# Patient Record
Sex: Female | Born: 1940
Health system: Southern US, Academic
[De-identification: ages and names within clinical notes are randomized; demographics above are authoritative.]

---

## 1997-01-12 ENCOUNTER — Encounter: Payer: Self-pay | Admitting: Internal Medicine

## 1999-01-07 ENCOUNTER — Other Ambulatory Visit (HOSPITAL_COMMUNITY): Payer: Self-pay | Admitting: FAMILY PRACTICE

## 2001-10-25 ENCOUNTER — Other Ambulatory Visit: Admission: RE | Admit: 2001-10-25 | Discharge: 2001-10-25 | Payer: Self-pay | Admitting: Obstetrics and Gynecology

## 2002-02-03 ENCOUNTER — Encounter: Payer: Self-pay | Admitting: Internal Medicine

## 2002-10-12 ENCOUNTER — Encounter: Admission: RE | Admit: 2002-10-12 | Discharge: 2002-10-12 | Payer: Self-pay | Admitting: Endocrinology

## 2002-10-12 ENCOUNTER — Encounter: Payer: Self-pay | Admitting: Endocrinology

## 2003-02-24 ENCOUNTER — Ambulatory Visit (HOSPITAL_COMMUNITY): Payer: Self-pay | Admitting: Family Medicine

## 2004-03-19 ENCOUNTER — Encounter (FREE_STANDING_LABORATORY_FACILITY): Payer: Self-pay | Admitting: Pathology

## 2005-02-07 ENCOUNTER — Encounter (FREE_STANDING_LABORATORY_FACILITY): Payer: Self-pay | Admitting: Pathology

## 2005-08-26 ENCOUNTER — Ambulatory Visit (INDEPENDENT_AMBULATORY_CARE_PROVIDER_SITE_OTHER): Payer: Self-pay | Admitting: "Endocrinology

## 2005-09-05 ENCOUNTER — Ambulatory Visit (INDEPENDENT_AMBULATORY_CARE_PROVIDER_SITE_OTHER): Payer: Self-pay

## 2005-11-03 ENCOUNTER — Encounter (INDEPENDENT_AMBULATORY_CARE_PROVIDER_SITE_OTHER): Payer: Self-pay | Admitting: Specialist

## 2005-11-03 ENCOUNTER — Ambulatory Visit (HOSPITAL_COMMUNITY): Admission: RE | Admit: 2005-11-03 | Discharge: 2005-11-04 | Payer: Self-pay | Admitting: Otolaryngology

## 2006-07-13 ENCOUNTER — Encounter: Admission: RE | Admit: 2006-07-13 | Discharge: 2006-07-13 | Payer: Self-pay | Admitting: Internal Medicine

## 2006-07-13 ENCOUNTER — Ambulatory Visit: Payer: Self-pay | Admitting: Internal Medicine

## 2006-08-31 ENCOUNTER — Inpatient Hospital Stay (HOSPITAL_COMMUNITY): Admission: EM | Admit: 2006-08-31 | Discharge: 2006-09-01 | Payer: Self-pay | Admitting: Emergency Medicine

## 2006-08-31 ENCOUNTER — Ambulatory Visit: Payer: Self-pay | Admitting: Cardiology

## 2006-08-31 ENCOUNTER — Encounter (INDEPENDENT_AMBULATORY_CARE_PROVIDER_SITE_OTHER): Payer: Self-pay | Admitting: Emergency Medicine

## 2006-09-01 ENCOUNTER — Ambulatory Visit: Payer: Self-pay | Admitting: Internal Medicine

## 2006-09-07 ENCOUNTER — Ambulatory Visit: Payer: Self-pay | Admitting: Internal Medicine

## 2006-11-05 ENCOUNTER — Encounter: Payer: Self-pay | Admitting: Internal Medicine

## 2006-11-05 DIAGNOSIS — R519 Headache, unspecified: Secondary | ICD-10-CM | POA: Insufficient documentation

## 2006-11-05 DIAGNOSIS — M19019 Primary osteoarthritis, unspecified shoulder: Secondary | ICD-10-CM | POA: Insufficient documentation

## 2006-11-05 DIAGNOSIS — K219 Gastro-esophageal reflux disease without esophagitis: Secondary | ICD-10-CM

## 2006-11-05 DIAGNOSIS — Z860100 Personal history of colon polyps, unspecified: Secondary | ICD-10-CM | POA: Insufficient documentation

## 2006-11-05 DIAGNOSIS — IMO0001 Reserved for inherently not codable concepts without codable children: Secondary | ICD-10-CM | POA: Insufficient documentation

## 2006-11-05 DIAGNOSIS — M129 Arthropathy, unspecified: Secondary | ICD-10-CM

## 2006-11-05 DIAGNOSIS — R51 Headache: Secondary | ICD-10-CM

## 2006-11-05 DIAGNOSIS — Z9189 Other specified personal risk factors, not elsewhere classified: Secondary | ICD-10-CM | POA: Insufficient documentation

## 2006-11-05 DIAGNOSIS — I10 Essential (primary) hypertension: Secondary | ICD-10-CM | POA: Insufficient documentation

## 2006-11-05 DIAGNOSIS — F329 Major depressive disorder, single episode, unspecified: Secondary | ICD-10-CM

## 2006-12-07 ENCOUNTER — Ambulatory Visit: Payer: Self-pay | Admitting: Internal Medicine

## 2006-12-07 DIAGNOSIS — R252 Cramp and spasm: Secondary | ICD-10-CM

## 2006-12-07 DIAGNOSIS — E109 Type 1 diabetes mellitus without complications: Secondary | ICD-10-CM | POA: Insufficient documentation

## 2006-12-07 DIAGNOSIS — R1310 Dysphagia, unspecified: Secondary | ICD-10-CM | POA: Insufficient documentation

## 2006-12-07 DIAGNOSIS — K222 Esophageal obstruction: Secondary | ICD-10-CM

## 2006-12-07 DIAGNOSIS — K648 Other hemorrhoids: Secondary | ICD-10-CM | POA: Insufficient documentation

## 2006-12-07 DIAGNOSIS — E785 Hyperlipidemia, unspecified: Secondary | ICD-10-CM

## 2006-12-08 LAB — CONVERTED CEMR LAB: CO2: 30 meq/L (ref 19–32)

## 2007-05-03 ENCOUNTER — Ambulatory Visit: Payer: Self-pay | Admitting: Gastroenterology

## 2007-06-04 ENCOUNTER — Ambulatory Visit: Payer: Self-pay | Admitting: Gastroenterology

## 2007-06-04 ENCOUNTER — Encounter: Payer: Self-pay | Admitting: Gastroenterology

## 2007-06-04 ENCOUNTER — Encounter: Payer: Self-pay | Admitting: Internal Medicine

## 2007-06-10 ENCOUNTER — Encounter: Payer: Self-pay | Admitting: Gastroenterology

## 2007-06-10 ENCOUNTER — Telehealth: Payer: Self-pay | Admitting: Gastroenterology

## 2007-06-11 ENCOUNTER — Ambulatory Visit: Payer: Self-pay | Admitting: Cardiology

## 2007-06-11 ENCOUNTER — Ambulatory Visit: Payer: Self-pay | Admitting: Gastroenterology

## 2007-06-11 LAB — CONVERTED CEMR LAB
ALT: 30 units/L (ref 0–35)
Basophils Absolute: 0.1 10*3/uL (ref 0.0–0.1)
Basophils Relative: 0.8 % (ref 0.0–1.0)
CO2: 29 meq/L (ref 19–32)
Calcium: 8.8 mg/dL (ref 8.4–10.5)
Eosinophils Absolute: 0.1 10*3/uL (ref 0.0–0.7)
Eosinophils Relative: 2.1 % (ref 0.0–5.0)
GFR calc Af Amer: 80 mL/min
GFR calc non Af Amer: 66 mL/min
Glucose, Bld: 248 mg/dL — ABNORMAL HIGH (ref 70–99)
HCT: 38.6 % (ref 36.0–46.0)
Hemoglobin: 13.3 g/dL (ref 12.0–15.0)
MCV: 89.1 fL (ref 78.0–100.0)
Neutrophils Relative %: 60.1 % (ref 43.0–77.0)
Platelets: 295 10*3/uL (ref 150–400)
Potassium: 4 meq/L (ref 3.5–5.1)
RBC: 4.33 M/uL (ref 3.87–5.11)
RDW: 12.8 % (ref 11.5–14.6)
Sodium: 140 meq/L (ref 135–145)
Total Protein: 6.5 g/dL (ref 6.0–8.3)
WBC: 6.8 10*3/uL (ref 4.5–10.5)

## 2007-06-14 ENCOUNTER — Encounter: Payer: Self-pay | Admitting: Gastroenterology

## 2007-06-21 ENCOUNTER — Telehealth (INDEPENDENT_AMBULATORY_CARE_PROVIDER_SITE_OTHER): Payer: Self-pay | Admitting: *Deleted

## 2007-06-25 ENCOUNTER — Ambulatory Visit: Payer: Self-pay | Admitting: Internal Medicine

## 2007-06-25 DIAGNOSIS — M1610 Unilateral primary osteoarthritis, unspecified hip: Secondary | ICD-10-CM | POA: Insufficient documentation

## 2007-06-25 DIAGNOSIS — IMO0002 Reserved for concepts with insufficient information to code with codable children: Secondary | ICD-10-CM

## 2007-07-06 ENCOUNTER — Encounter: Payer: Self-pay | Admitting: Internal Medicine

## 2007-07-12 ENCOUNTER — Encounter: Payer: Self-pay | Admitting: Gastroenterology

## 2007-07-12 ENCOUNTER — Ambulatory Visit: Payer: Self-pay | Admitting: Gastroenterology

## 2007-07-14 ENCOUNTER — Encounter: Payer: Self-pay | Admitting: Gastroenterology

## 2007-07-14 ENCOUNTER — Encounter: Admission: RE | Admit: 2007-07-14 | Discharge: 2007-07-14 | Payer: Self-pay | Admitting: Orthopedic Surgery

## 2007-07-22 ENCOUNTER — Encounter: Admission: RE | Admit: 2007-07-22 | Discharge: 2007-07-22 | Payer: Self-pay | Admitting: Orthopedic Surgery

## 2007-08-10 ENCOUNTER — Encounter: Admission: RE | Admit: 2007-08-10 | Discharge: 2007-08-10 | Payer: Self-pay | Admitting: Orthopedic Surgery

## 2007-08-26 ENCOUNTER — Telehealth: Payer: Self-pay | Admitting: Internal Medicine

## 2007-09-01 ENCOUNTER — Ambulatory Visit: Payer: Self-pay | Admitting: Gastroenterology

## 2007-11-24 ENCOUNTER — Encounter: Payer: Self-pay | Admitting: Internal Medicine

## 2007-11-30 ENCOUNTER — Encounter: Payer: Self-pay | Admitting: Internal Medicine

## 2008-03-27 ENCOUNTER — Ambulatory Visit: Payer: Self-pay | Admitting: Internal Medicine

## 2008-03-27 ENCOUNTER — Inpatient Hospital Stay (HOSPITAL_COMMUNITY): Admission: EM | Admit: 2008-03-27 | Discharge: 2008-03-28 | Payer: Self-pay | Admitting: Emergency Medicine

## 2008-03-28 ENCOUNTER — Telehealth: Payer: Self-pay | Admitting: Internal Medicine

## 2008-04-11 ENCOUNTER — Telehealth: Payer: Self-pay | Admitting: Internal Medicine

## 2008-05-03 ENCOUNTER — Encounter: Admission: RE | Admit: 2008-05-03 | Discharge: 2008-05-03 | Payer: Self-pay | Admitting: Orthopedic Surgery

## 2008-05-08 ENCOUNTER — Encounter: Admission: RE | Admit: 2008-05-08 | Discharge: 2008-05-08 | Payer: Self-pay | Admitting: Orthopedic Surgery

## 2008-05-25 ENCOUNTER — Observation Stay (HOSPITAL_COMMUNITY): Admission: EM | Admit: 2008-05-25 | Discharge: 2008-05-26 | Payer: Self-pay | Admitting: Emergency Medicine

## 2008-09-11 ENCOUNTER — Telehealth: Payer: Self-pay | Admitting: Internal Medicine

## 2008-09-18 ENCOUNTER — Encounter: Payer: Self-pay | Admitting: Internal Medicine

## 2008-11-23 ENCOUNTER — Encounter: Payer: Self-pay | Admitting: Internal Medicine

## 2008-11-23 ENCOUNTER — Telehealth: Payer: Self-pay | Admitting: Internal Medicine

## 2009-03-07 ENCOUNTER — Ambulatory Visit: Payer: Self-pay | Admitting: Internal Medicine

## 2009-03-07 ENCOUNTER — Encounter: Admission: RE | Admit: 2009-03-07 | Discharge: 2009-03-07 | Payer: Self-pay | Admitting: Family Medicine

## 2009-03-08 ENCOUNTER — Telehealth: Payer: Self-pay | Admitting: Internal Medicine

## 2009-03-23 ENCOUNTER — Encounter: Payer: Self-pay | Admitting: Internal Medicine

## 2009-05-09 ENCOUNTER — Telehealth: Payer: Self-pay | Admitting: Internal Medicine

## 2009-05-17 ENCOUNTER — Ambulatory Visit: Payer: Self-pay | Admitting: Internal Medicine

## 2009-05-17 DIAGNOSIS — R198 Other specified symptoms and signs involving the digestive system and abdomen: Secondary | ICD-10-CM | POA: Insufficient documentation

## 2009-05-21 ENCOUNTER — Ambulatory Visit: Payer: Self-pay | Admitting: Cardiology

## 2009-05-28 ENCOUNTER — Telehealth: Payer: Self-pay | Admitting: Internal Medicine

## 2009-05-29 ENCOUNTER — Telehealth: Payer: Self-pay | Admitting: Internal Medicine

## 2009-05-30 ENCOUNTER — Telehealth: Payer: Self-pay | Admitting: Internal Medicine

## 2009-09-07 ENCOUNTER — Telehealth (INDEPENDENT_AMBULATORY_CARE_PROVIDER_SITE_OTHER): Payer: Self-pay | Admitting: *Deleted

## 2010-02-24 ENCOUNTER — Encounter: Payer: Self-pay | Admitting: Orthopedic Surgery

## 2010-03-03 LAB — CONVERTED CEMR LAB
Basophils Absolute: 0.1 10*3/uL (ref 0.0–0.1)
Basophils Relative: 0.9 % (ref 0.0–3.0)
Calcium: 9 mg/dL (ref 8.4–10.5)
Chloride: 101 meq/L (ref 96–112)
Creatinine, Ser: 0.9 mg/dL (ref 0.4–1.2)
Eosinophils Absolute: 0.2 10*3/uL (ref 0.0–0.7)
Eosinophils Relative: 2.8 % (ref 0.0–5.0)
HCT: 38.7 % (ref 36.0–46.0)
Hemoglobin: 12.9 g/dL (ref 12.0–15.0)
Lymphocytes Relative: 36.5 % (ref 12.0–46.0)
Lymphs Abs: 2.4 10*3/uL (ref 0.7–4.0)
Monocytes Absolute: 0.5 10*3/uL (ref 0.1–1.0)
Neutro Abs: 3.5 10*3/uL (ref 1.4–7.7)
WBC: 6.7 10*3/uL (ref 4.5–10.5)

## 2010-03-05 NOTE — Letter (Signed)
Summary: Reather Littler MD  Reather Littler MD   Imported By: [address] 05/23/2009 09:48:11  _____________________________________________________________________  External Attachment:    Type:   Image     Comment:   External Document

## 2010-03-05 NOTE — Letter (Signed)
Summary: [facility]  [facility]   Imported By: Sherian Rein 03/28/2009 08:18:05  _____________________________________________________________________  External Attachment:    Type:   Image     Comment:   External Document

## 2010-03-05 NOTE — Progress Notes (Signed)
  Phone Note Other Incoming   Request: Send information Action Taken: Software engineer of Call: Request for records received from [facility]. Request forwarded to [employer].

## 2010-03-05 NOTE — Assessment & Plan Note (Signed)
Summary: PER KELLY--R SIDE PAIN--PER PT D/T--STC   Vital Signs:  Patient profile:   70 year old female Height:      67 inches (170.18 cm) Weight:      218 pounds (99.09 kg) BMI:     34.27 O2 Sat:      95 % on Room air Temp:     98.7 degrees F (37.06 degrees C) oral Pulse rate:   92 / minute Pulse rhythm:   regular BP sitting:   102 / 74  (left arm) Cuff size:   large  Vitals Entered By: Brenton Grills (May 17, 2009 8:53 AM)  O2 Flow:  Room air CC: pt c/o left side pain in lower back that is sharp and stabbing at times. pt describes the pain 10/10 when she has it/pt also states that her insurance will no longer pay for omeprazole and will need a replacement/aj   Primary Care Provider:  Lucas Mallow, MD  CC:  pt c/o left side pain in lower back that is sharp and stabbing at times. pt describes the pain 10/10 when she has it/pt also states that her insurance will no longer pay for omeprazole and will need a replacement/aj.  History of Present Illness: Patient returns for intermittent sharp stabbing pain in the left flank. This is the same problem for which she was seen in Feb. Renal U/S was done and was normal. Labs including CBC - nl, Metabolic - normal except for glucose 300+, A1C 8.2%, B12 266. She has not had hematuria, no dysuria, no fever or chills. She has had abdominal pain, heartburn, etc since stopping omeprazole 40mg . She does report recent episode of hematochezia with no prior h/o hemorrhoids. Bowel movement has been painful and she has had an intermittent/variable bowel habit.  Current Medications (verified): 1)  Novolog 100 Unit/ml  Soln (Insulin Aspart) .... Insulin Pump -Take As Directed 2)  Micardis Hct 80-12.5 Mg  Tabs (Telmisartan-Hctz) .... Take 1 Tablet By Mouth Once A Day 3)  Clonidine Hcl 0.1 Mg  Tabs (Clonidine Hcl) .... Take 1 Tablet By Mouth Two Times A Day 4)  Cymbalta 60 Mg  Cpep (Duloxetine Hcl) .... Take 1 Tablet By Mouth Once A Day 5)  Zocor 40 Mg   Tabs (Simvastatin) .... Take 1 Tablet By Mouth Once A Day 6)  Aggrenox 25-200 Mg  Cp12 (Aspirin-Dipyridamole) .... Take 1 Tablet By Mouth Two Times A Day 7)  Omeprazole 20 Mg  Tbec (Omeprazole) .... Take 2 Tablet By Mouth Two Times A Day 8)  Fish Oil 1200 Mg  Caps (Omega-3 Fatty Acids) .... Take 2 Tablet By Mouth Once A Day 9)  Vitamin B Complex-C   Caps (B Complex-C) .... Take 1 Tablet By Mouth Once A Day 10)  Multivitamins   Tabs (Multiple Vitamin) .... Take 1 Tablet By Mouth Once A Day 11)  Nasonex 50 Mcg/act Susp (Mometasone Furoate) .Marland Kitchen.. 1-2 Sprays Each Nostril Once Daily 12)  Advair Diskus 250-50 Mcg/dose Misc (Fluticasone-Salmeterol) .Marland Kitchen.. 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) HEADACHE (ICD-784.0) DEPRESSION (ICD-311) * HISTORY OF MINI STROKES dysphagaia esophageal stricture plantar fascitiits hemorrhoids Physician Roster:  Endo - Drinda Butts                            GIGerilyn Pilgrim                            Gyn - Harvett Corder, PA Hyperlipidemia  Past Surgical History: Last updated: 11/05/2006 Hysterectomy Thyroidectomy Cholecystectomy * SALIVARY GLAND EXCISION * JAW SURGERY BREAST BIOPSY, HX OF (ICD-V15.9)  Family History: Last updated: 12/07/2006 arthritis in both parents grandparent with colon cancer heart disease in both parents stroke in mother and sister. 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  The patient denies anorexia, fever, weight loss, weight gain, hoarseness, chest pain, syncope, dyspnea on exertion, muscle weakness, suspicious skin lesions, difficulty walking, unusual weight change, and angioedema.    Physical Exam  General:  obese white female in no distress Head:  Normocephalic and atraumatic without obvious abnormalities. No apparent alopecia or balding. Eyes:  corneas and lenses clear and no injection.   Lungs:  normal respiratory effort.   Heart:  normal rate and regular rhythm.   Abdomen:  obese, BS +, insulin pump right lower quadrant, no guarding or rebound, no masses. Rectal:  NST, internal hemorrhoid - small- at 0700 position. Stool heme negative.   Impression & Recommendations:  Problem # 1:  FLANK PAIN, LEFT (ICD-789.09) Negative eval to date. Her pain may be related to nephrolithiasis.  Plan - CT for stone/.  Her updated medication list for this problem includes:    Nabumetone 500 Mg Tabs (Nabumetone)  Orders: Radiology Referral (Radiology)  Problem # 2:  HYPERTENSION (ICD-401.9)  Her updated medication list for this problem includes:    Micardis Hct 80-12.5 Mg Tabs (Telmisartan-hctz) .Marland Kitchen... Take 1 tablet by mouth once a day    Clonidine Hcl 0.1 Mg Tabs (Clonidine hcl) .Marland Kitchen... Take 1 tablet by mouth two times a day  BP today: 102/74 Prior BP: 124/70 (03/07/2009)  Labs Reviewed: K+: 3.7 (03/07/2009) Creat: : 0.9 (03/07/2009)     Great control.   Problem # 3:  INTERNAL HEMORRHOIDS WITHOUT MENTION COMP (ICD-455.0) Patient with report of blood on tissue. Exam with small internal hemorrhoid.  Plan - sitz baths           no straining at stool   Problem # 4:  CHANGE IN BOWELS (ZOX-096.04) Patient with an  irregular bowel habit.   Plan - bulk laxative of choice on a routine basis.   Problem # 5:  GERD (ICD-530.81) Patient with mild symptoms of GERD  Plan - trial of H2 blocker for control of symptoms.  Her updated medication list for this problem includes:    Ranitidine Hcl 150 Mg Caps (Ranitidine hcl) .Marland Kitchen... 1 by mouth two times a day  Complete Medication List: 1)  Novolog 100 Unit/ml Soln (Insulin aspart) .... Insulin pump -take as directed 2)  Micardis Hct 80-12.5 Mg Tabs (Telmisartan-hctz) .... Take 1 tablet by mouth once a day 3)  Clonidine Hcl 0.1 Mg Tabs (Clonidine hcl) .... Take 1 tablet by mouth  two times a day 4)  Cymbalta 60 Mg Cpep (Duloxetine hcl) .... Take 1 tablet by mouth once a day 5)  Zocor 40 Mg Tabs (Simvastatin) .... Take 1 tablet by mouth once a day 6)  Aggrenox 25-200 Mg Cp12 (Aspirin-dipyridamole) .... Take 1 tablet by mouth two times a day 7)  Ranitidine Hcl 150 Mg Caps (Ranitidine hcl) .Marland Kitchen.. 1 by mouth two times a day 8)  Fish Oil 1200 Mg Caps (Omega-3 fatty acids) .... Take 2 tablet by mouth once a day 9)  Vitamin B Complex-c Caps (B complex-c) .... Take 1 tablet by mouth once a day 10)  Multivitamins Tabs (Multiple vitamin) .... Take 1 tablet by mouth once a day 11)  Nasonex 50 Mcg/act Susp (Mometasone furoate) .Marland Kitchen.. 1-2 sprays each nostril once daily 12)  Advair Diskus 250-50 Mcg/dose Misc (Fluticasone-salmeterol) .Marland Kitchen.. 1 puff two times a day 13)  Nabumetone 500 Mg Tabs (Nabumetone) 14)  Omega-3 1000 Mg Caps (Omega-3 fatty acids)  Patient Instructions: 1)  Intermittent flank pain - may be a stone. Exam is normal. U/S kidney was normal in Feb. Plan - CT scan to look for stone. 2)  Blood on toilet tissue - most likely a minor hemorrhoid. NO sign of colon bleeding. 3)  Irregular bowel habit - take metamucil, or equivalent, daily to regulate bowel habit 4)  Dyspepsia- for heartburn, etc. take over the counter Ranitidine 150mg  two times a day.   Prescriptions: RANITIDINE HCL 150 MG CAPS (RANITIDINE HCL) 1 by mouth two times a day  #60 x 12   Entered and Authorized by:   Jacques Navy MD   Signed by:   Jacques Navy MD on 05/17/2009   Method used:   Print then Give to Patient   RxID:   [id number]    Preventive Care Screening  Mammogram:    Date:  02/03/2009    Results:  normal

## 2010-03-05 NOTE — Progress Notes (Signed)
Summary: REFERRAL  Phone Note From Other Clinic   Summary of Call: Please see other phone note, Pt continues to c/o pain, please put in urology referral. Thanks Initial call taken by: Lamar Sprinkles, CMA,  May 29, 2009 6:55 PM  Follow-up for Phone Call        [facility] notified Follow-up by: Jacques Navy MD,  May 30, 2009 9:09 AM

## 2010-03-05 NOTE — Assessment & Plan Note (Signed)
Summary: FEELS LIKE THERE IS A LUMP WHEN  LAYS ON SIDE/ SINUS PROBLEMS...   Vital Signs:  Patient profile:   70 year old female Height:      67 inches Weight:      217 pounds BMI:     34.11 O2 Sat:      98 % on Room air Temp:     97.1 degrees F oral Pulse rate:   80 / minute BP sitting:   124 / 70  (left arm) Cuff size:   regular  Vitals Entered By: Bill Salinas CMA (March 07, 2009 8:55 AM)  O2 Flow:  Room air CC: pt here with complaint of ongoing sinus drainage with cough x 1 months. She also complains of what she describes as a lump or knot on her left side of her back, pt has never had zostavax or bone density screening, Dr Claretha Cooper is her GYN doc/ ab   Primary Care Provider:  Lucas Mallow, MD  CC:  pt here with complaint of ongoing sinus drainage with cough x 1 months. She also complains of what she describes as a lump or knot on her left side of her back, pt has never had zostavax or bone density screening, and Dr Claretha Cooper is her GYN doc/ ab.  History of Present Illness: C/o pain in the left flank: it hurts when she lays on that side - feels like a golf ball. She will occasionally get a real sharp pain. No dysuria, no frequency, no F/S/E. No trauma or physical strain. No blood in the urine. No weight. No night sweats.   She sees Dr. Lucianne Muss for diabetes. He stated she might need her "hormones" checked due to difficulty in controlling her diabetes.   Current Medications (verified): 1)  Novolog 100 Unit/ml  Soln (Insulin Aspart) .... Insulin Pump -Take As Directed 2)  Micardis Hct 80-12.5 Mg  Tabs (Telmisartan-Hctz) .... Take 1 Tablet By Mouth Once A Day 3)  Clonidine Hcl 0.1 Mg  Tabs (Clonidine Hcl) .... Take 1 Tablet By Mouth Two Times A Day 4)  Cymbalta 60 Mg  Cpep (Duloxetine Hcl) .... Take 1 Tablet By Mouth Once A Day 5)  Zocor 40 Mg  Tabs (Simvastatin) .... Take 1 Tablet By Mouth Once A Day 6)  Aggrenox 25-200 Mg  Cp12 (Aspirin-Dipyridamole) .... Take 1 Tablet By Mouth  Two Times A Day 7)  Omeprazole 20 Mg  Tbec (Omeprazole) .... Take 2 Tablet By Mouth Two Times A Day 8)  Fish Oil 1200 Mg  Caps (Omega-3 Fatty Acids) .... Take 2 Tablet By Mouth Once A Day 9)  Vitamin B Complex-C   Caps (B Complex-C) .... Take 1 Tablet By Mouth Once A Day 10)  Multivitamins   Tabs (Multiple Vitamin) .... Take 1 Tablet By Mouth Once A Day 11)  Nasonex 50 Mcg/act Susp (Mometasone Furoate) .Marland Kitchen.. 1-2 Sprays Each Nostril Once Daily 12)  Advair Diskus 250-50 Mcg/dose Misc (Fluticasone-Salmeterol) .Marland Kitchen.. 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) HEADACHE (ICD-784.0) DEPRESSION (ICD-311) * HISTORY OF MINI STROKES dysphagaia esophageal stricture plantar fascitiits hemorrhoids Physician Roster:  Endo - Drinda Butts                            GIGerilyn Pilgrim                            Gyn - Harvett Corder, PA Hyperlipidemia  Past Surgical History: Last updated: 11/05/2006 Hysterectomy Thyroidectomy Cholecystectomy * SALIVARY GLAND EXCISION * JAW SURGERY BREAST BIOPSY, HX OF (ICD-V15.9)  Family History: Last updated: 12/07/2006 arthritis in both parents grandparent with colon cancer heart disease in both parents stroke in mother and sister. Hypertension in both parents Diabetes mother and sister  Family History: Reviewed history from 12/07/2006 and no changes required. arthritis in both parents grandparent with colon cancer heart disease in both parents stroke in mother and sister. 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    Physical Exam  General:  alert, well-developed, well-nourished, and well-hydrated, overweight.   Head:  normocephalic, atraumatic, and no abnormalities observed.   Eyes:  pupils equal, pupils round, corneas and lenses clear, and no injection.   Neck:  supple, no thyromegaly, and no carotid bruits.   Chest Wall:  no deformity Lungs:  Clear to percussion. No increased work of breathing Heart:  normal rate, regular rhythm, and no murmur.   Abdomen:  tneder to palpation of the left fland and tender to percussion. No CVAT. BS+, no guarding. Tender to gentle movement, palpation in the left quadrants. No masses appreciated. Msk:  No deformity or scoliosis noted of thoracic or lumbar spine.   Pulses:  2+ radial Neurologic:  alert & oriented X3, cranial nerves II-XII intact, strength normal in all extremities, and gait normal.   Skin:  Intact without suspicious lesions or rashes Cervical Nodes:  no anterior cervical adenopathy and no posterior cervical adenopathy.   Axillary Nodes:  no R axillary adenopathy and no L axillary adenopathy.   Psych:  Oriented X3, memory intact for recent and remote, normally interactive, and good eye contact.    Patient: Norma Young Note: All result statuses are Final unless otherwise noted.  Tests: (1) BMP (METABOL)   Sodium                    136 mEq/L                   135-145   Potassium                 3.7 mEq/L                    3.5-5.1   Chloride                  101 mEq/L                   96-112   Carbon Dioxide            29 mEq/L  19-32   Glucose              [H]  380 mg/dL                   09-81   BUN                       8 mg/dL                     1-91   Creatinine                0.9 mg/dL                   4.7-8.2   Calcium                   9.0 mg/dL                   9.5-62.1   GFR                       66.02 mL/min                >60  Tests: (2) CBC Platelet w/Diff (CBCD)   White Cell Count          6.7 K/uL                    4.5-10.5   Red Cell Count            4.29 Mil/uL                 3.87-5.11   Hemoglobin                12.9 g/dL                   30.8-65.7   Hematocrit                38.7 %                      36.0-46.0   MCV                       90.2 fl                     78.0-100.0   MCHC                      33.3 g/dL                   84.6-96.2   RDW                       12.0 %                      11.5-14.6   Platelet Count            317.0 K/uL                  150.0-400.0   Neutrophil %              52.1 %                      43.0-77.0   Lymphocyte %  36.5 %                      12.0-46.0   Monocyte %                7.7 %                       3.0-12.0   Eosinophils%              2.8 %                       0.0-5.0   Basophils %               0.9 %                       0.0-3.0   Neutrophill Absolute      3.5 K/uL                    1.4-7.7   Lymphocyte Absolute       2.4 K/uL                    0.7-4.0   Monocyte Absolute         0.5 K/uL                    0.1-1.0  Eosinophils, Absolute                             0.2 K/uL                    0.0-0.7   Basophils Absolute        0.1 K/uL                    0.0-0.1  Tests: (3) Hemoglobin A1C (A1C)   Hemoglobin A1C       [H]  8.5 %                       4.6-6.5     Glycemic Control Guidelines for People with Diabetes:     Non Diabetic:  <6%     Goal of Therapy: <7%     Additional Action  Suggested:  >8%   Tests: (4) B12 + Folate Panel (B12/FOL)   Vitamin B12               266 pg/mL                   211-911   Folate                    18.1 ng/mL     Deficient  0.4 - 3.4 ng/mL     Indeterminate  3.4 - 5.4 ng/mL     Normal  >5.4 ng/mL  Tests: (5) TSH (TSH)   FastTSH                   2.02 uIU/mL                 0.35-5.50 Impression & Recommendations:  Problem # 1:  FLANK PAIN, LEFT (ICD-789.09)  Patient with left flank pain and tenderness on exam. No symptoms of UTI. concern for renal cell carcinoma or other structual problem vs. nephrolithiasis.  Plan -  lab - U/A with micro, CBC, BMET           Renal U/S - today at [facility] and [facility] at 11:15 - pt aware.  Her updated medication list for this problem includes:    Nabumetone 500 Mg Tabs (Nabumetone)  Addendum - lab is normal - normal renal function. U/A evidently not done.                     Renal ultra-sound normal: no structual abnormalities or stones  Suspect her pain may be musculo-skeletal. For persistence will need to consider CT. Orders: TLB-BMP (Basic Metabolic Panel-BMET) (80048-METABOL) TLB-CBC Platelet - w/Differential (85025-CBCD) Prescription Created Electronically [phone]) TLB-Udip w/ Micro (81001-URINE) Radiology Referral (Radiology)  Complete Medication List: 1)  Novolog 100 Unit/ml Soln (Insulin aspart) .... Insulin pump -take as directed 2)  Micardis Hct 80-12.5 Mg Tabs (Telmisartan-hctz) .... Take 1 tablet by mouth once a day 3)  Clonidine Hcl 0.1 Mg Tabs (Clonidine hcl) .... Take 1 tablet by mouth two times a day 4)  Cymbalta 60 Mg Cpep (Duloxetine hcl) .... Take 1 tablet by mouth once a day 5)  Zocor 40 Mg Tabs (Simvastatin) .... Take 1 tablet by mouth once a day 6)  Aggrenox 25-200 Mg Cp12 (Aspirin-dipyridamole) .... Take 1 tablet by mouth two times a day 7)  Omeprazole 20 Mg Tbec (Omeprazole) .... Take 2 tablet by mouth two times a day 8)  Fish Oil 1200 Mg Caps (Omega-3 fatty acids)  .... Take 2 tablet by mouth once a day 9)  Vitamin B Complex-c Caps (B complex-c) .... Take 1 tablet by mouth once a day 10)  Multivitamins Tabs (Multiple vitamin) .... Take 1 tablet by mouth once a day 11)  Nasonex 50 Mcg/act Susp (Mometasone furoate) .Marland Kitchen.. 1-2 sprays each nostril once daily 12)  Advair Diskus 250-50 Mcg/dose Misc (Fluticasone-salmeterol) .Marland Kitchen.. 1 puff two times a day 13)  Nabumetone 500 Mg Tabs (Nabumetone) 14)  Omega-3 1000 Mg Caps (Omega-3 fatty acids)  Other Orders: TLB-A1C / Hgb A1C (Glycohemoglobin) (83036-A1C) TLB-B12 + Folate Pnl (60454_[id number]/NWG) TLB-TSH (Thyroid Stimulating Hormone) (84443-TSH)  S RENAL - [id number]     Clinical Data: Left flank pain   RENAL/URINARY TRACT ULTRASOUND COMPLETE   Comparison:  None.   Findings:   Right Kidney:  11.4 cm in length.  No hydronephrosis.  There is a 4 cm simple cyst of the upper pole. No hydronephrosis.   Left Kidney:  10.6 cm.  No hydronephrosis, cysts, or masses.   No renal calculi are noted.  No dilatation of the ureters.   Bladder:  Only minimally distended without obvious pathology.   IMPRESSION:   1.  Normal renal size without hydronephrosis or calculi. 2.  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 3.  No acute or significant findings.   Read By:  Bernerd Limbo,  M.D.Prescriptions: OMEPRAZOLE 20 MG  TBEC (OMEPRAZOLE) Take 2 tablet by mouth two times a day  #90 x 3   Entered and Authorized by:   Jacques Navy MD   Signed by:   Jacques Navy MD on 03/07/2009   Method used:   Electronically to        [facility]  [address] (retail)       [address]       [place], [place]  [address]       Ph: [phone]  Fax: [phone]   RxID:   [id number]    Preventive Care Screening  Mammogram:    Date:  11/07/2008    Results:  normal   Last Flu Shot:    Date:  11/04/2008    Results:  given

## 2010-03-05 NOTE — Progress Notes (Signed)
Summary: results  Phone Note Call from Patient Call back at Home Phone [phone]   Summary of Call: Patient called for lab/xray results. Please advise. Initial call taken by: Lucious Groves,  March 08, 2009 2:55 PM  Follow-up for Phone Call        Renal U/S was normal with no findings. the labs were normal except for a A1C of 8.5%. Suspect her pain is a musculo-skeletal problem. Please continue to take nabumetone 500mg  2 tabs at bedtime. For persistent pain or worsening symptoms the next step will be a CT scan abdomen Follow-up by: Jacques Navy MD,  March 08, 2009 3:09 PM  Additional Follow-up for Phone Call Additional follow up Details #1::        Notified pt with both labs & u/s results and md recomendations Additional Follow-up by: Orlan Leavens,  March 08, 2009 3:33 PM

## 2010-03-05 NOTE — Progress Notes (Signed)
Summary: Urology referral  Phone Note Outgoing Call   Call placed by: Dagoberto Reef,  May 30, 2009 2:53 PM Summary of Call: Dr Filbert Schilder, Norma Young pt about appt with Dr Vernie Ammons ([facility]) 06/19/09 @ 1245pm, pt said she was moving to [place] the wk of (may 2-6).Said she would have to find another primary care doctor there and have them refer her to a urologist.Pt declined appt. Initial call taken by: Dagoberto Reef,  May 30, 2009 2:56 PM  Follow-up for Phone Call        noted. Follow-up by: Jacques Navy MD,  May 30, 2009 3:30 PM

## 2010-03-05 NOTE — Progress Notes (Signed)
Summary: not better  Phone Note Call from Patient Call back at Home Phone [phone]   Summary of Call: Patient left message on triage that she is not better, c/o pain in her sides/kidneys, and spine. Please advise. Initial call taken by: Lucious Groves,  May 09, 2009 9:55 AM  Follow-up for Phone Call        reviewed last office note, labs- normal, abdominal U/S normal.  For continued pain recommend repeat OV. May take her current meds and may add APAP 1000mg  three times a day until seen. Follow-up by: Jacques Navy MD,  May 09, 2009 10:46 AM  Additional Follow-up for Phone Call Additional follow up Details #1::        Patient notified and states that she takes her current pain med two times a day. Patient also states that Dr. Lucianne Muss gave her a muscles relaxer. Transferred patient to schedule appt. Additional Follow-up by: Lucious Groves,  May 09, 2009 3:50 PM

## 2010-03-05 NOTE — Progress Notes (Signed)
Summary: RESULTS  Phone Note Call from Patient Call back at Home Phone [phone]   Summary of Call: Patient is requesting results of CT Initial call taken by: Lamar Sprinkles, CMA,  May 28, 2009 3:46 PM  Follow-up for Phone Call        CT negative for any cause of her discomfort. 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  Follow-up by: Jacques Navy MD,  May 28, 2009 5:42 PM  Additional Follow-up for Phone Call Additional follow up Details #1::        Pt continues to c/o pain, same as last seen.  Additional Follow-up by: Lamar Sprinkles, CMA,  May 28, 2009 6:18 PM    Additional Follow-up for Phone Call Additional follow up Details #2::    Pt is concerned with cyst found on ct, she states she has pain when she lays on her left side. Pt wants to know what if anything will be the next step regarding this cyst.  Follow-up by: Ami Bullins CMA,  May 29, 2009 10:22 AM  Additional Follow-up for Phone Call Additional follow up Details #3:: Details for Additional Follow-up Action Taken: Renal cysts are common. This one is mid-range in size and is not a cause of discomfort. For continued pain I am happy to refer to urology for a second opinion about the renal cyst. Additional Follow-up by: Jacques Navy MD,  May 29, 2009 1:02 PM

## 2010-05-15 LAB — URINALYSIS, ROUTINE W REFLEX MICROSCOPIC
Bilirubin Urine: NEGATIVE
Glucose, UA: >1000 mg/dL — AB
Ketones, ur: 40 mg/dL — AB
Nitrite: NEGATIVE
Specific Gravity, Urine: 1.032 — ABNORMAL HIGH (ref 1.005–1.030)
pH: 6 (ref 5.0–8.0)

## 2010-05-15 LAB — COMPREHENSIVE METABOLIC PANEL
AST: 32 U/L (ref 0–37)
Calcium: 8.7 mg/dL (ref 8.4–10.5)
Chloride: 102 mEq/L (ref 96–112)
Creatinine, Ser: 1.02 mg/dL (ref 0.4–1.2)
Potassium: 4.1 mEq/L (ref 3.5–5.1)
Total Protein: 6.3 g/dL (ref 6.0–8.3)

## 2010-05-15 LAB — BASIC METABOLIC PANEL
Calcium: 8.5 mg/dL (ref 8.4–10.5)
Creatinine, Ser: 0.7 mg/dL (ref 0.4–1.2)
GFR calc Af Amer: >60 mL/min (ref >60)
GFR calc non Af Amer: >60 mL/min (ref >60)
Sodium: 136 mEq/L (ref 135–145)

## 2010-05-15 LAB — GLUCOSE, CAPILLARY
Glucose-Capillary: 231 mg/dL — ABNORMAL HIGH (ref 70–99)
Glucose-Capillary: 249 mg/dL — ABNORMAL HIGH (ref 70–99)
Glucose-Capillary: 290 mg/dL — ABNORMAL HIGH (ref 70–99)
Glucose-Capillary: 34 mg/dL — CL (ref 70–99)
Glucose-Capillary: 500 mg/dL — ABNORMAL HIGH (ref 70–99)
Glucose-Capillary: 540 mg/dL (ref 70–99)

## 2010-05-15 LAB — CBC
Hemoglobin: 12.9 g/dL (ref 12.0–15.0)
MCHC: 34 g/dL (ref 30.0–36.0)
MCV: 88.3 fL (ref 78.0–100.0)
Platelets: 263 10*3/uL (ref 150–400)
RBC: 4.31 MIL/uL (ref 3.87–5.11)
RDW: 12.8 % (ref 11.5–15.5)

## 2010-05-15 LAB — URINE MICROSCOPIC-ADD ON

## 2010-05-15 LAB — DIFFERENTIAL
Basophils Absolute: 0 10*3/uL (ref 0.0–0.1)
Basophils Relative: 0 % (ref 0–1)
Eosinophils Relative: 1 % (ref 0–5)
Lymphs Abs: 0.9 10*3/uL (ref 0.7–4.0)
Monocytes Relative: 4 % (ref 3–12)
Neutrophils Relative %: 87 % — ABNORMAL HIGH (ref 43–77)

## 2010-05-21 LAB — POCT I-STAT, CHEM 8
Glucose, Bld: 94 mg/dL (ref 70–99)
HCT: 38 % (ref 36.0–46.0)
Hemoglobin: 12.9 g/dL (ref 12.0–15.0)
Potassium: 3.4 mEq/L — ABNORMAL LOW (ref 3.5–5.1)
Sodium: 139 mEq/L (ref 135–145)

## 2010-05-21 LAB — GLUCOSE, CAPILLARY
Glucose-Capillary: 110 mg/dL — ABNORMAL HIGH (ref 70–99)
Glucose-Capillary: 117 mg/dL — ABNORMAL HIGH (ref 70–99)
Glucose-Capillary: 132 mg/dL — ABNORMAL HIGH (ref 70–99)
Glucose-Capillary: 231 mg/dL — ABNORMAL HIGH (ref 70–99)
Glucose-Capillary: 270 mg/dL — ABNORMAL HIGH (ref 70–99)
Glucose-Capillary: 485 mg/dL — ABNORMAL HIGH (ref 70–99)
Glucose-Capillary: 511 mg/dL (ref 70–99)
Glucose-Capillary: 98 mg/dL (ref 70–99)

## 2010-05-21 LAB — DIFFERENTIAL
Basophils Absolute: 0 10*3/uL (ref 0.0–0.1)
Eosinophils Absolute: 0.1 10*3/uL (ref 0.0–0.7)
Eosinophils Relative: 1 % (ref 0–5)

## 2010-05-21 LAB — COMPREHENSIVE METABOLIC PANEL
ALT: 23 U/L (ref 0–35)
AST: 24 U/L (ref 0–37)
CO2: 27 mEq/L (ref 19–32)
Chloride: 103 mEq/L (ref 96–112)
Creatinine, Ser: 0.78 mg/dL (ref 0.4–1.2)
GFR calc Af Amer: >60 mL/min (ref >60)
GFR calc non Af Amer: >60 mL/min (ref >60)
Sodium: 136 mEq/L (ref 135–145)
Total Bilirubin: 0.3 mg/dL (ref 0.3–1.2)

## 2010-05-21 LAB — URINALYSIS, ROUTINE W REFLEX MICROSCOPIC
Glucose, UA: 100 mg/dL — AB
Protein, ur: NEGATIVE mg/dL
Specific Gravity, Urine: 1.011 (ref 1.005–1.030)
pH: 6.5 (ref 5.0–8.0)

## 2010-05-21 LAB — PROTIME-INR: Prothrombin Time: 13.6 seconds (ref 11.6–15.2)

## 2010-05-21 LAB — URINE CULTURE: Colony Count: 80000

## 2010-05-21 LAB — CBC
Hemoglobin: 12.4 g/dL (ref 12.0–15.0)
MCV: 89.7 fL (ref 78.0–100.0)
RBC: 4.03 MIL/uL (ref 3.87–5.11)
WBC: 11.4 10*3/uL — ABNORMAL HIGH (ref 4.0–10.5)

## 2010-05-21 LAB — HEMOGLOBIN A1C: Hgb A1c MFr Bld: 7 % — ABNORMAL HIGH (ref 4.6–6.1)

## 2010-05-21 LAB — POCT CARDIAC MARKERS: Myoglobin, poc: 112 ng/mL (ref 12–200)

## 2010-05-21 LAB — URINE MICROSCOPIC-ADD ON

## 2010-05-21 LAB — LIPASE, BLOOD: Lipase: 19 U/L (ref 11–59)

## 2010-06-18 NOTE — H&P (Signed)
Norma [name], Norma [name]               ACCOUNT NO.:  [id number]   MEDICAL RECORD NO.:  [id number]          PATIENT TYPE:  INP   LOCATION:  6707                         FACILITY:  [facility]   PHYSICIAN:  Valerie A. Felicity Coyer, MDDATE OF BIRTH:  1940-06-19   DATE OF ADMISSION:  08/31/2006  DATE OF DISCHARGE:                              HISTORY & PHYSICAL   PRIMARY CARE PHYSICIAN:  Rosalyn Gess. Norins, M.D.   ENDOCRINOLOGIST:  Reather Littler, M.D.   CHIEF COMPLAINT:  Left-sided weakness.   HISTORY OF PRESENT ILLNESS:  The patient is a 70 year old woman, obese,  with multiple risk factors and previous history of TIA who presents to  the emergency room today complaining of left-sided weakness.  She  reports she first noticed the symptoms around 5:00 a.m. this morning  when she awoke, feeling like her left leg was dragging and her right arm  was discoordinated.  However, she ate her breakfast and had her coffee  prior to going to physical therapy for her routine scheduled.  At  approximately 9 to 9:30 this morning the physical therapist confirmed  that the left side was in fact weak.  She was referred to Dr. Debby Bud'  office who then sent her to the emergency room for further evaluation.  Her family notes slurring of speech and drooling earlier along with  facial asymmetry which now seems to have improved.  The patient at this  time feels improved overall with increased and improved left upper  extremity coordination.  Unclear of her left foot symptoms, as she  reports she has not been off the stretcher since her arrival in the  emergency room.   PAST MEDICAL HISTORY:  1. Type 2 diabetes for which she has a continuous NovoLog pump.  See      details below.  2. Hypertension.  3. Dyslipidemia.  4. History of TIAs.  5. Osteoarthritis, bilateral knees and shoulders.  6. History of depression.  7. History of fibromyalgia.  8. History of GERD.  9. History of colon polyps in 2000.   PAST SURGICAL  HISTORY:  1. Salivary gland excision in October of last year.  2. Breast biopsy in 1993 which was benign.  3. Cholecystectomy in 1987.   SOCIAL HISTORY:  She is retired and divorced.  She has two sons and two  daughters who are present, showing a very supportive family involved in  her care.  She does not smoke and does not drink.   FAMILY HISTORY:  Significant for mother who passed away from  complications of stroke and also suffered with heart disease, type 2  diabetes, and hypertension.  Father also passed away due to cardiac  abnormalities.  He had history of hypertension.   CURRENT MEDICATIONS:  1. NovoLog insulin pump with the wizard bolus.  Self-adjusting rate      is as follows:  12 midnight 1.4 units; 5 a.m., 2.1 units; 10 a.m.,      2.2 units; 1 p.m., 2.0 units; and 10 p.m., 1.8 units.  She corrects      for carb intake as per home schedule and  reports that this has been      working well.  2. Micardis 80/12.5 daily,  3. Zoloft 50 mg daily.  4. Clonidine 0.1 mg b.i.d.  5. Cymbalta 60 mg daily.  6. Zocor 40 mg q.h.s.  7. Aspirin 81 mg daily.   ALLERGIES:  NO KNOWN DRUG ALLERGIES.   REVIEW OF SYSTEMS:  She denies fevers, chills.  No shortness of breath  or chest pain.  No nausea, vomiting, or diarrhea.  No rashes.  Mild  occipital headache for the last 4-5 days, currently present only on the  right side, 2/10 in severity, without significant change in the last 2  days.  No lower extremity swelling.  No visual changes, choking, or  difficulty speaking.   PHYSICAL EXAMINATION:  VITAL SIGNS:  Temperature 97.2, blood pressure  104/60, heart rate 70, respirations 18, saturating 96% on room air.  GENERAL:  She is a pleasant overweight woman who is in no acute  distress.  Her family is at bedside.  HEENT:  Unremarkable.  LUNGS:  Clear to auscultation bilaterally.  CARDIOVASCULAR:  Regular rate and rhythm.  ABDOMEN:  Obese, soft, nontender, with good bowel sounds.  No  rebound or  guarding.  EXTREMITIES:  Trace edema bilaterally.  NEUROLOGIC:  She is awake, alert, and oriented x4.  Cranial nerves II-  XII are symmetrically intact.  She has a slightly discoordinated left  hand with rapid movement and 4/5 grip, 4+/5 remaining left upper  extremity strength, and likewise left foot and lower extremity with 4+/5  response and strength.  Gait not tested.  Right side appears normal 5/5  without any delay.  Her speech is fluent.  Cognition intact.  Deep  tendon reflexes are normal throughout.   LABORATORY DATA:  Unremarkable.  CBC with normal white count, hemoglobin  of 13.2, normal platelet count.  ISTAT also unremarkable.  Specifically,  creatinine 0.8, glucose notable for 220.  Protime is normal.  Urinalysis  is negative.   CT of the head:  Question of occult stroke in the right frontal or  parietal region.  MRI of brain confirms an acute right thalamic and  internal capsule stroke.  MRA shows no proximal stenosis.   ASSESSMENT AND PLAN:  1. Acute right basal ganglia stroke.  Onset greater than 3 hours and      therefore not a tissue plasminogen activator candidate.  Her      symptoms suspicious for stroke are confirmed with MRI, as above.      Will now admit for stroke workup to include a 2-D echocardiogram,      carotid Dopplers, trans Doppler, as well as a physical therapy and      occupational therapy evaluation.  Neurology team has been called by      the emergency room physician and will evaluate and follow per recs.      At this time I plan to increase her aspirin to 325 mg per day but      will defer other antiplatelet changes to stroke team as per their      recommendations.  Will continue other risk factor evaluation and      management.  See details below regarding diabetes, hypertension,      and cholesterol.  The patient is seemingly symptomatic well and      will consider home as soon as September 01, 2006, tomorrow, if doing      well with  therapy and other evaluation as listed above are  stable.  2. Type 2 diabetes.  The patient is insulin-dependent on her NovoLog      pump as scheduled.  A1c has been ordered, and she is followed by      endocrine specialist, Dr. Lucianne Muss.  Will continue her management of      glycemic control during this hospitalization per her home schedule,      and the diabetic coordinator has been notified of her admission and      will follow up in the morning to assist with questions or      complications should they arise.  3. Hypertension.  Continue home medications as prior to admission and      monitor her blood pressure while hospitalized.  4. Dyslipidemia.  Continue home Zocor, and will recheck fasting lipid      profile and homocystine in the morning.  Please see orders.      Valerie A. Felicity Coyer, MD  Electronically Signed     VAL/MEDQ  D:  08/31/2006  T:  09/01/2006  Job:  [id number]
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
## 2010-06-18 NOTE — Discharge Summary (Signed)
Norma [name], Norma [name]               ACCOUNT NO.:  [id number]   MEDICAL RECORD NO.:  [id number]          PATIENT TYPE:  INP   LOCATION:  5501                         FACILITY:  [facility]   PHYSICIAN:  Valerie A. Felicity Coyer, MDDATE OF BIRTH:  [birth year]   DATE OF ADMISSION:  03/27/2008  DATE OF DISCHARGE:  03/28/2008                               DISCHARGE SUMMARY   DISCHARGE DIAGNOSES:  1. Uncontrolled insulin-dependent type 2 diabetes, labile with severe      hypoglycemia and severe hyperglycemia, now controlled.  See details      below with no insulin pump settings.  2. Hypertension.  3. Dyslipidemia.  4. History of right basal ganglia stroke, July 2008.  Continue      antiplatelet.  5. Osteoarthritis.  6. Gastroesophageal reflux disease.  7. History of depression and fibromyalgia.   DISCHARGE MEDICATIONS:  Insulin pump with settings as follows:  Basal  insulin 1.2 units from 12 midnight to 5:00 a.m.; 2.0 units from 5:00  a.m. to 7:00 a.m.; 2.2 units from 7:00 a.m. to 12 noon; 2.1 units from  12 noon to 12 midnight.  Her bolus is instructed to be 1 unit per 10  grams of carb and her goal blood glucose is 140.   OTHER MEDICATIONS:  1. Aggrenox 1 tablet p.o. b.i.d.  2. Clonidine 0.1 mg b.i.d.  3. Cymbalta 60 mg p.o. daily.  4. Advair Diskus b.i.d., dose unclear.  5. Micardis 80 mg daily.  6. Zocor 40 mg daily.  7. Glucophage 500 mg p.o. b.i.d.   DISPOSITION:  The patient is discharged home in medically stable and  improved condition.  Hospital followup will be with endocrinologist, Dr.  Reather Littler to be scheduled by the patient with his office within the  next week per his instruction.  Also with primary care physician, Dr.  Illene Regulus.  We will schedule appointment for the next 2-3 weeks for  followup of routine issues.   CONSULTATIONS THIS HOSPITALIZATION:  Reather Littler, MD, Endocrinology   CONDITION ON DISCHARGE:  Medically improved and stable.  CBGs ranging  from 98-120  since resuming insulin pump.   HOSPITAL COURSE:  1. Labile type 2 diabetes, on insulin pump.  The patient is a 70-year-      old woman with ongoing outpatient titration for her insulin pump      under the direction of Dr. Lucianne Muss who had been in daily contact to      our office until last week when she was unable to get further      instructions on use of her pump.  She subsequently was found to be      unresponsive by her family in the setting of severe hypoglycemia in      the 41s with treatment and discontinuation of her insulin pump.      Her CBGs then ranged greater than 500.  She was admitted for      further evaluation of other possible triggers and no abnormality on      telemetry.  Cardiac enzymes or infectious abnormalities could be  identified including a negative chest x-ray and negative urine      culture.  She was seen in consultation by Dr. Lucianne Muss while she was      on an IV insulin Glucommander protocol for instructions on      transitioning off insulin drip once her CBCs again were under good      control and she was tolerating p.o.  He made the recommendations to      change her insulin settings for her pump as noted above and in the      last 12 hours, the patient has done incredibly well on this      treatment.  The patient has been educated as has her daughter by      Dr. Lucianne Muss and myself regarding management and monitoring of her      CBGs as well as use of glucagon for emergencies.  She is instructed      to follow up with him in 1 week and to call for any questions prior      to that time.  This plan is agreeable to the patient and her      daughter who felt medically stable and ready for discharge.  Again,      there had been no other medical infectious, neurologic, or cardiac      issues for labile changes and diabetes as noted.  2. Other medical issues.  The patient's other chronic medical issues      are as listed.  No other changes have been made in her  regimen      except to the insulin pump as described.   TIME SPENT:  Greater than 30 minutes on day of discharge for  coordination of planning.      Valerie A. Felicity Coyer, MD  Electronically Signed     VAL/MEDQ  D:  03/28/2008  T:  03/29/2008  Job:  [id number]

## 2010-06-18 NOTE — Consult Note (Signed)
NAMEMARISABEL, Norma [name]               ACCOUNT NO.:  [id number]   MEDICAL RECORD NO.:  [id number]          PATIENT TYPE:  EMS   LOCATION:  MAJO                         FACILITY:  [facility]   PHYSICIAN:  Pramod P. Pearlean Brownie, MD    DATE OF BIRTH:  1940/11/21   DATE OF CONSULTATION:  08/31/2006  DATE OF DISCHARGE:                                 CONSULTATION   REFERRING PHYSICIAN:  Markham Jordan L. Effie Shy, M.D.   REASON FOR REFERRAL:  Stroke.   HISTORY OF PRESENT ILLNESS:  Norma [name] is a 70 year old Caucasian lady  who woke up today from sleep with left-sided weakness and some slurred  speech and left facial asymmetry.  The patient's symptoms seemed to be  improving after she had come to the emergency room.  She has been having  headaches for a week.  She denies left-sided sensory loss and  coordination.  She does have a previous history of TIA in 1999 with  slurred speech and left hand weakness which resolved within a day.  She  has been on aspirin since then.   PAST MEDICAL HISTORY:  Significant for  1. Diabetes.  2. Hypertension.  3. TIA.  4. Headache.  5. Gastroesophageal reflux disease.  6. Fibromyalgia.  7. Depression.  8. Osteoarthritis.   HOME MEDICATIONS:  Insulin pump, Micardis, Zoloft, clonidine, Cymbalta,  aspirin.   ALLERGIES TO MEDICATIONS:  ADHESIVE TAPES.   PAST SURGICAL HISTORY:  1. Hysterosalpingo-oophorectomy 1982.  2. Thyroidectomy 1983.  3. Cholecystectomy 1987.  4. Salivary gland excision 2007.  5. Jaw surgery.   FAMILY HISTORY:  Strongly positive for stroke in the patient's sister,  brother and mother.   SOCIAL HISTORY:  The patient is presently retired.  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  She does not smoke or drink.   REVIEW OF SYSTEMS:  Not significant for any chest pain, fever, cough,  chills, shortness of breath, diarrhea or other illness.   PHYSICAL EXAMINATION:  GENERAL APPEARANCE:  Obese, middle-aged Caucasian  lady who is at present  not in distress.  VITAL SIGNS:  She is afebrile with temperature 97.2, pulse rate 91 per  minute and regular, blood pressure 129/79, sats 98% on room air,  respiratory rate 20 per minute.  Distal pulses are well felt.  HEENT:  Head is nontraumatic.  ENT unremarkable.  NECK:  Supple without bruit.  CARDIAC:  Regular heart sounds.  LUNGS:  Clear to auscultation.  NEUROLOGIC:  The patient is pleasant, awake, alert, cooperative.  There  is no aphasia, apraxia or dysarthria.  Pupils are equal and reactive.  Visual acuity and fields seem adequate.  Face is symmetric.  There is  minimum nasal labial fold asymmetry when she smiles.  Tongue is midline.  Motor system exam reveals no upper extremity drift but fine finger  movements are diminished on the left.  She orbits the right over left  upper extremity.  She has good grip and shoulder strength on the left.  Left lower extremity exam reveals no drift but there is minimum weakness  of left hip flexors.  Deep tendon reflexes  are 2+ symmetric.  Plantars  are downgoing.  Sensation is intact.  Gait was not tested.   DATA REVIEW:  CT scan of the head shows no acute abnormality.  MRI scan  of the brain shows small acute infarct in the right posterior limb of  internal capsule.  MRA of the brain shows no significant intracranial  stenosis.   WBC count is normal.  UA is negative.  Electrolytes are normal.  Blood  glucose is elevated 227.   IMPRESSION:  A 70-year lady with right internal capsule infarction  secondary to small-vessel disease from diabetes, hypertension.  She has  presented beyond time window for thrombolysis.  We will admit her for  further stroke workup.  Check carotid, transcranial Doppler studies, 2-D  echocardiogram, fasting lipid profile, hemoglobin A1c and homocysteine.  Change aspirin to Aggrenox for secondary stroke prevention.  Physical  and occupational therapy consult.  We will be happy to follow up in the  future as  needed.  Thank you for referring the patient.           ______________________________  Sunny Schlein. Pearlean Brownie, MD     PPS/MEDQ  D:  08/31/2006  T:  09/01/2006  Job:  [id number]
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
## 2010-06-18 NOTE — H&P (Signed)
NAMEMARRAH, [name]               ACCOUNT NO.:  [id number]   MEDICAL RECORD NO.:  [id number]          PATIENT TYPE:  EMS   LOCATION:  MAJO                         FACILITY:  [facility]   PHYSICIAN:  Norma [name], MDDATE OF BIRTH:  Dec 28, 1940   DATE OF ADMISSION:  03/27/2008  DATE OF DISCHARGE:                              HISTORY & PHYSICAL   PRIMARY CARE Norma [name]:  Norma [name], M.D.   ENDOCRINOLOGIST:  Norma [name], M.D.   CHIEF COMPLAINT:  Hypoglycemia.   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  She has been adjusting her pump regularly  lately for episodes of hypoglycemia, and Dr. Lucianne Muss has been following  that, but she lost connection with the office for the past few days as  they lost their phone, and she was not able to adjust her pump.  Her  blood sugars continued to drop on occasion and at night.  This night,  her daughter found her moaning and groaning, and then she lost  consciousness.  EMS was called, and her blood sugar was in the 30s.  After receiving D-50, she came about and quickly came to her baseline,  but throughout her stay in the emergency department continued to drop  occasionally down to the low 50s, at which point her insulin pump was  completely discontinued, and she was allowed to eat and given D-5 half  normal.  That is when her blood sugar went up to the 200s, at which  point [facility] hospitalist was called for admission and further evaluation  for [place].   PAST MEDICAL HISTORY:  1. Hypertension.  2. Depression.  3. Hyperlipidemia.  4. Questionable COPD.  5. TIA.   ALLERGIES:  No known medicine allergies.   SOCIAL HISTORY:  The patient never smoked, does not use drugs, does not  drink.  Lives at home with her daughter.   FAMILY HISTORY:  Noncontributory.   MEDICATIONS:  1. Clonidine 0.1 mg b.i.d.  2. Cymbalta 60 daily.  3. Micardis 80 daily.  4. Zocor 40 daily.  5. Advair dose  unknown b.i.d.  6. Glucophage 500 b.i.d.  7. Aggrenox 1 tablet p.o. b.i.d.  8. Symlin 120 mg before she eats.  She had forgotten to give herself      her injection last night.   PHYSICAL EXAMINATION:  VITAL SIGNS:  Temperature not obtained.  Blood  pressure 106/58, pulse 73, respirations 12, satting 98% on room air.  GENERAL:  The patient appears to be completely back to her baseline, in  no acute distress.  HEENT:  Head nontraumatic.  Moist mucous membranes.  LUNGS:  Clear to auscultation bilaterally.  HEART:  Regular rate and rhythm.  No murmurs, rubs or gallops.  ABDOMEN:  Soft, nontender, nondistended.  LOWER EXTREMITIES:  Without clubbing, cyanosis or edema.  NEUROLOGIC:  Intact.   LABORATORY DATA:  White blood cell count 11.4, hemoglobin 12.9.  Sodium  139, potassium 3.4, creatinine 0.8, blood sugar 94, but on the last  repeat was in the mid 200s.  Albumin 3.1, lipase 19.  Cardiac enzymes  negative.   CHEST X-RAY:  No acute cardiopulmonary process.   ASSESSMENT/PLAN:  This is a 70 year old type 1 diabetic with episodes of  hypoglycemia, now hyperglycemic.  1. Type 1 diabetes.  The patient is currently off her pump.      Apparently, her pump needs to be probably readjusted to avoid this      episode of hypoglycemia.  For now, will hold off on the pump until      the patient can be seen by endocrinology inpatient or until we can      obtain a consult from endocrinology and discuss her care.  Will      need to call Dr. Lucianne Muss in the a.m. in order to see how the pump      could be adjusted and if he could come in and adjust it.  Will ask      the family to do so if they are able to.  For right now, for basal      will use Lantus.  Since the patient has been hypoglycemic on her      current regimen, will just do 5 units, but this probably needs to      be upgraded once we are able to discuss with Dr. Lucianne Muss.  Will put      on sliding scale for now in addition to the Lantus and  watch her      blood sugars carefully with frequent blood sugar checks.  Will give      Lantus once we are sure that there are no more hypoglycemic events.      Will recheck blood sugars q.2h. times at least 3 p.r.n.  Check      hemoglobin A1c and of course now will hold metformin and will hold      Symlin.  2. Hypertension.  Currently slightly hypotensive.  Will check      orthostatics.  Hold clonidine and Micardis.  3. Depression.  Continue Cymbalta.  4. Hyperlipidemia.  Continue Zocor.  5. History of chronic obstructive pulmonary disease.  Continue Advair.  6. Prophylaxis - Protonix plus Lovenox.      Norma Cowboy, MD  Electronically Signed     AVD/MEDQ  D:  03/27/2008  T:  03/27/2008  Job:  [id number]   cc:   Norma Gess. Debby Bud, MD  Norma [name], M.D.

## 2010-06-18 NOTE — Assessment & Plan Note (Signed)
[facility]                           PRIMARY CARE OFFICE NOTE   Norma, [name]                      MRN:          [id number]  DATE:07/13/2006                            DOB:          [birth year]    Norma [name] is a 70 year old woman who presents to reestablish for  ongoing continuity care.  She was last seen in the office February 09, 2003.  In interval since that time, the patient has had a thorough  evaluation for cardiac disease including a stress test performed Jun 18, 2006 which was read out as unremarkable, after an ER evaluation for  chest pain.  The patient also had a question of a pinched nerve in her  neck as the source of her arm pain, which lead her to her ER evaluation.   The patient did have excision of a mass to the left neck October 2007  which turned out to be an infected salivary gland.  She has made a good  recovery.   The patient has significant insulin dependent diabetes.  She is followed  by Dr. Reather Littler.   PAST MEDICAL HISTORY:   SURGICAL:  1. Hysterectomy with salpingo-oophorectomy in 1982.  2. 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  3. Cholecystectomy in 1987.  4. Breast biopsy in 1993 that was benign.  5. Jaw surgery for placement of orthopedic device.  6. Salivary gland excision October 2007.   MEDICAL ILLNESSES:  1. Usual childhood disease.  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  3. Fibromyalgia.  4. Depression.  5. Diabetes.  6. Hypertension.  7. History of mini strokes.  8. Colon polyps with last colonoscopy in 2000.  9. Headaches.  10.The patient also with GERD.   PHYSICIAN ROSTER:  Dr. Henderson Cloud for gynecology and Ms Terrill Mohr,  [place].  Dr. Lucianne Muss for Endocrinology.  Patient has seen Dr. Chales Abrahams in the  past for GI.   CURRENT MEDICATIONS:  1. NovoLog insulin pump.  2. Micardis 80/12.6 daily.  3. Zoloft 50 mg daily.  4. Clonidine 0.1 mg b.i.d.  5. Cymbalta 60 mg daily.  6. Zocor 40 mg  daily.  7. Aspirin daily.  8. Symlin 120 mcg injected every p.m.   FAMILY HISTORY:  Both parents with arthritis, colon cancer in  grandparent, both parents with heart disease, mother with a stroke,  sister with a stroke.  Both parents with hypertension.  Mother with  diabetes.  Sister with diabetes.  Niece with leukemia.   SOCIAL HISTORY:  The patient completed the 12th grade.  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  She was married for 48-  1/2 years, divorced and has been single since.  The patient has 2 sons,  2 daughters, 4 grandchildren.   HABITS:  Tobacco none, alcohol none.   REVIEW OF SYSTEMS:  The patient has had no constitutional problems.  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  The  patient reports she has new dentures that fit well, but no other ENT  complaints or cardiovascular, respiratory complaints.  GI patient with  reflux for which she takes [place].  No GU complaints.  Musculoskeletal  significant for significant discomfort at the 3rd left PIP joint which  was swollen and tender.  No dermatologic or neurologic complaints.   EXAMINATION:  Temperature was 96.8, blood pressure 153/88.  Pulse was  77.  Respirations were 14.  Weight is 227.  GENERAL APPEARANCE:  This is an overweight Caucasian female in no acute  distress.  HEENT:  Normocephalic, atraumatic.  EACs and TMs were normal. Oropharynx  with dentures in place.  No buccal lesions were noted.  Posterior  pharynx was clear.  Conjunctiva, sclerae were clear.  Funduscopic exam  deferred to ophthalmology.  NECK:  Was supple without thyromegaly.  No lymphadenopathywas noted in  the cervical or  supraclavicular regions.  CHEST:  No CVA tenderness.  LUNGS:  Were clear to auscultation and percussion.  CARDIOVASCULAR:  2+ radial pulse, no JVD or carotid bruits.  She had a  quiet precordium with a regular rate and rhythm without murmurs, rubs or  gallops.   BREASTS:  Deferred to gynecology.  ABDOMEN:  Soft, no guarding or rebound, no organosplenomegaly was noted.  GENITALIA AND RECTAL:  Deferred to gynecology.  EXTREMITIES:  Without clubbing, cyanosis, edema or deformity.  NEUROLOGIC:  The patient has decreased sensations to soft touch and pin  prick in the index finger and thumb on the left arm, also at the  forearm.  She had normal grip strength, normal proximal muscle strength.  She had normal range of motion.   REVIEW OF OUTSIDE DATA:  A nuclear myocardial perfusion scan August 12, 2005 was read out as a negative study with no evidence of ischemia.  Multiple radiographic studies were available which showed no active  disease in the chest, CT of the soft tissues of the neck preceding  excision of masses noted.  Last laboratory from June 28. 2007 reveals a  BUN of 11, creatinine 0.8, CPK was 225, LDL direct was 77, total  cholesterol was 133, HDL was 46.  Hemoglobin A1C was 8.3%.  White count  was 5,800 with a hemoglobin of 13.5 grams, electrolytes were  unremarkable except for a glucose of 219.   ASSESSMENT AND PLAN:  1. Diabetes.  The patient is followed by Dr. Reather Littler who has been      managing her medications with followup.  2. Lipids.  The patient's last lipid profile from June 2008 showed      patient to be at goal of an LDL of  77.  She will continue on her      present dose of Zocor.  3. Hypertension.  The patient is mildly elevated at today's visit.      Would have her follow up on this at her convenience.  We will make      an adjustment in her medications if she continued to have systolic      blood pressures greater than 130.  4. Fibromyalgia.  The patient is doing well with Cymbalta and Zoloft      and she will continue the same.  5. Neuro, patient with a question of nerve entrapment causing mild      radiculopathy in the left arm.  Would recommend we start with     regular flexion and extension C-spine series and  following this      would move ahead if she continues to have symptoms with MRI scan      with consideration  for possible surgical intervention.  6. Health maintenance.  The patient is current and up to date with her      gynecologist.  I do not have any colonoscopy reports on her record      nor in the materials brought to me and therefore she would be a      candidate for routine colorectal cancer screening with colonoscopy,      which can be arranged for her at her convenience.  We will discuss      this with her at her next office visit.   SUMMARY:  This is a very pleasant woman with problems as outlined above.  Who presents to reestablish ongoing primary care.     Rosalyn Gess Norins, MD  Electronically Signed    MEN/MedQ  DD: 07/14/2006  DT: 07/14/2006  Job #: [id number]   cc:   Burman Riis, M.D.

## 2010-06-21 NOTE — Op Note (Signed)
Norma, Young               ACCOUNT NO.:  [id number]   MEDICAL RECORD NO.:  [id number]          PATIENT TYPE:  OIB   LOCATION:  5703                         FACILITY:  [facility]   PHYSICIAN:  Newman Pies, MD            DATE OF BIRTH:  [birth year]   DATE OF PROCEDURE:  11/03/2005  DATE OF DISCHARGE:                                 OPERATIVE REPORT   SURGEON:  Su-Wooi Teoh.   PREOPERATIVE DIAGNOSIS:  Left parotid mass.   POSTOPERATIVE DIAGNOSIS:  Left parotid mass.   PROCEDURE:  Left lateral parotidectomy with facial nerve dissection.  CPT  code 16109.   ANESTHESIA:  General endotracheal tube anesthesia.   COMPLICATIONS:  None.   ESTIMATED BLOOD LOSS:  25 mL.   INDICATION FOR PROCEDURE:  [name] is a 70 year old white female  who began noticing swelling at the left angle of her mandible in June of  this year.  She was seen by her primary care physician in [place].  She was placed on a 10-day course of antibiotics.  The mass persisted in  spite of the antibiotic treatment.  She was referred to a local ENT  physician.  A fine-needle aspiration was performed.  However, it was  nondiagnostic.  A CT scan of her neck showed bilobed cystic mass at the left  posterior parotid space overlying the lateral margin of the  sternocleidomastoid muscle.  Over the last 3 months, [name], it continues to be mildly tender to touch,  and the patient would like the mass removed.  The risks, benefits,  alternatives, and details of the procedure were discussed with the patient,  and she wished to proceed with the above-stated procedure.  All questions  were answered, and informed consent was obtained.   DESCRIPTION OF PROCEDURE:  The patient was taken to the operating room and  placed supine on the operating table.  General endotracheal tube anesthesia  was administered by the anesthesiologist.  Preop IV antibiotic was given.  Patient  was then positioned and prepped and draped in the standard fashion  for left superficial parotidectomy with facial nerve dissection.  One  percent lidocaine with 1:100,000 epinephrine was injected at the planned  site of incisions.  A lazy-S incision was made anterior to the left oracle  and inferior to the auricular lobe.  The incision was curved gently into the  superior left lateral neck.  A skin flap was elevated above the parotid  fascia using a #15 scalpel.  Posteriorly, over the lateral margin of the  sternocleidomastoid muscles, a fibrotic mass was noted to be adhered to the  lateral aspect of the muscle.  The mass is no longer cystic.  Using  meticulous blunt dissections, the mass was dissected free from the muscles.  The anterior branch of the great auricular nerve was identified.  It had to  be sacrificed due to its involvement with the fibrotic mass.  The parotid  gland was then meticulously dissected free from the auricular cartilage.  Hemostasis was obtained throughout the procedure using bipolar cautery,  suture ligation, and unipolar cautery.  The main trunk of the facial nerve  was identified approximately 1 cm inferior to the tragal pointer.  The main  trunk of the facial nerve and branches of the facial nerve were meticulously  dissected from the parotid parenchyma.  The cervical, buccal, and the  marginal mandibular branch of the facial nerve were all identified and  preserved.  The inferior and posterior portion of the parotid gland was  dissected away from the facial nerve.  A cystic mass was noted at the  posterior-inferior portion of the mass.  Once the branches of the facial  nerves were identified, the posterior-inferior lobe of the parotid gland was  dissected free together with the cystic mass within it.  The resected  tissue, together with fibrotic mass overlying the sternocleidomastoid  muscles, were sent to the pathology department for permanent histologic   identification.  Stimulation of the facial nerve at the end of the case  showed that the facial nerve was intact and functional.  The surgical field  was copiously irrigated.  All bleeding sources were controlled.  A #7 JP  drain was placed, and the skin flap was closed in layers using 3-0 Vicryl  and 5-0 nylon sutures.   The care of the patient was turned over to the anesthesiologist.  The  patient was awakened from anesthesia without difficulty.  She was  transferred to the recovery room in good condition.   OPERATIVE FINDINGS:  A cystic mass was noted within the posterior-inferior  portion of the parotid gland.  In addition, a fibrotic mass was noted to be  adherent to the lateral aspect of the sternocleidomastoid muscles.  Both  masses were resected free along with posterior-inferior parotid gland.  The  specimen was sent to the pathology department for permanent histologic  identification.   POSTOPERATIVE CARE:  The patient will be observed in the hospital overnight.  Her drain will be connected to wall suction.  She will likely be discharged  home on postop day #1 after the drain is removed.  Patient will be given  antibiotic and pain medications.  She will follow up in my office in 1 week  for suture removal.      Newman Pies, MD  Electronically Signed     ST/MEDQ  D:  11/03/2005  T:  11/04/2005  Job:  [id number]

## 2010-11-18 LAB — DIFFERENTIAL
Eosinophils Absolute: 0.2
Lymphocytes Relative: 33
Lymphs Abs: 2.3
Neutrophils Relative %: 55

## 2010-11-18 LAB — CARDIAC PANEL(CRET KIN+CKTOT+MB+TROPI)
Relative Index: 1.6
Troponin I: 0.02

## 2010-11-18 LAB — COMPREHENSIVE METABOLIC PANEL
Alkaline Phosphatase: 67
BUN: 6
CO2: 28
Chloride: 106
Creatinine, Ser: 0.87
GFR calc non Af Amer: >60
Glucose, Bld: 172 — ABNORMAL HIGH
Potassium: 3.5
Total Bilirubin: 0.7

## 2010-11-18 LAB — I-STAT 8, (EC8 V) (CONVERTED LAB)
Chloride: 101
HCT: 43
Hemoglobin: 14.6
Operator id: 285841
Potassium: 3.6
Sodium: 136
TCO2: 27
pH, Ven: 7.359 — ABNORMAL HIGH

## 2010-11-18 LAB — URINALYSIS, ROUTINE W REFLEX MICROSCOPIC
Nitrite: NEGATIVE
Protein, ur: NEGATIVE
Urobilinogen, UA: 0.2

## 2010-11-18 LAB — LIPID PANEL
HDL: 37 — ABNORMAL LOW
LDL Cholesterol: 85
Triglycerides: 122
VLDL: 24

## 2010-11-18 LAB — CBC
MCV: 84.8
Platelets: 328
WBC: 6.9

## 2010-11-18 LAB — APTT: aPTT: 29

## 2010-11-18 LAB — POCT I-STAT CREATININE
Creatinine, Ser: 0.8
Operator id: 285841

## 2010-11-18 LAB — TROPONIN I: Troponin I: 0.02

## 2011-09-24 IMAGING — CT CT ABD-PELV W/O CM
2 of 4 series · 17 of 46 positions shown, 19 images · non-contrast
Comparison: [HOSPITAL] CT abdomen and pelvis exam dated
06/11/2007.

CLINICAL DATA: Left flank pain.  Nausea.  Prior cholecystectomy and
hysterectomy.

CT ABDOMEN AND PELVIS WITHOUT CONTRAST
TECHNIQUE: Multidetector CT imaging of the abdomen and pelvis was
performed following the standard protocol without intravenous
contrast.

[Series 2: ap without · axial · non-contrast · 0.71mm/px · z∈[+951,+1381]mm · 14 of 94 slices shown, 16 images]
[im 4/94  soft-tissue]
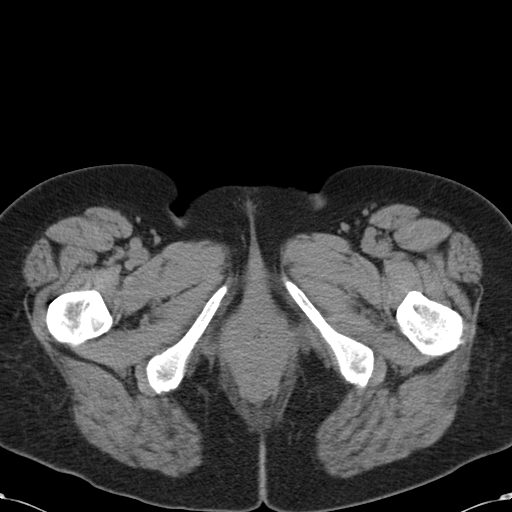
[im 4/94  bone]
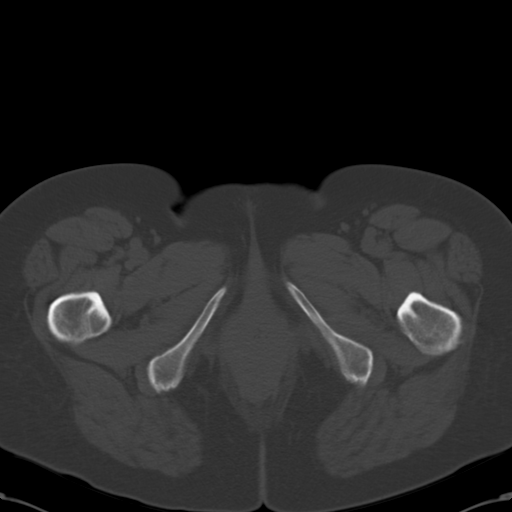
[im 12/94  soft-tissue]
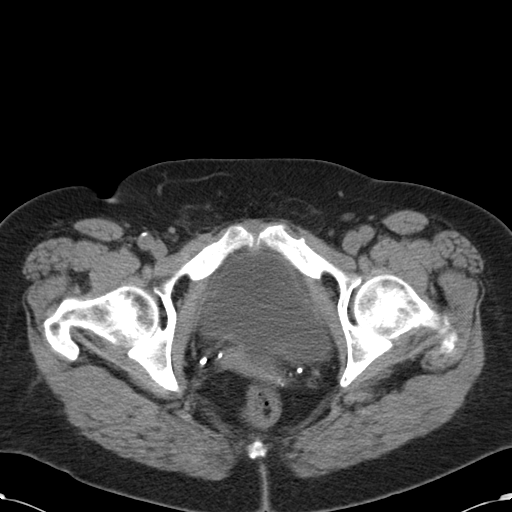
[im 19/94  soft-tissue]
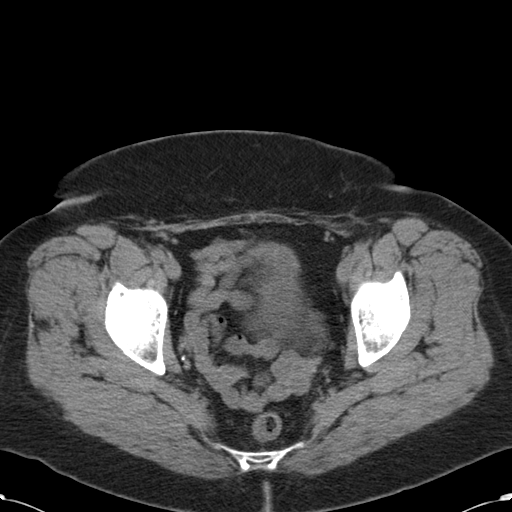
[im 27/94  soft-tissue]
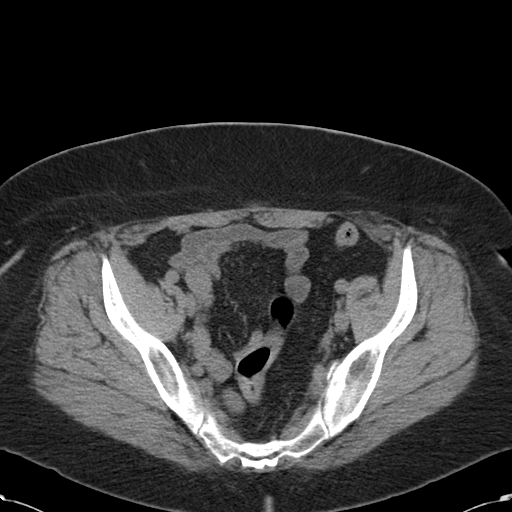
[im 30/94  soft-tissue]
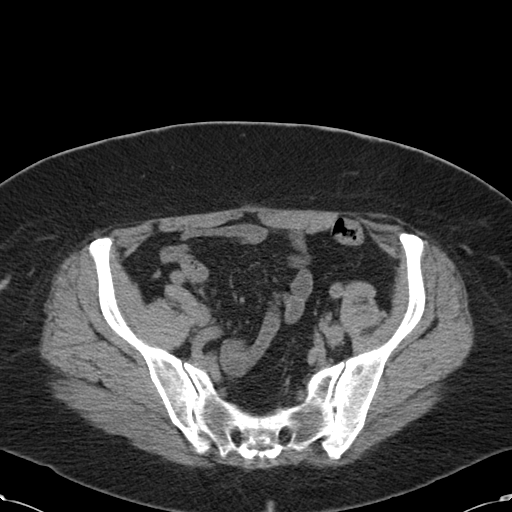
[im 38/94  soft-tissue]
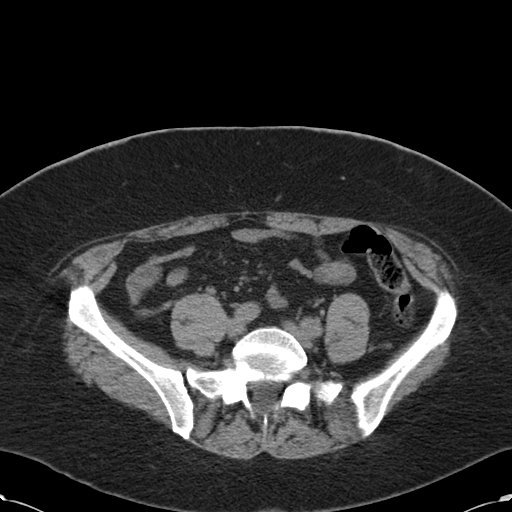
[im 45/94  soft-tissue]
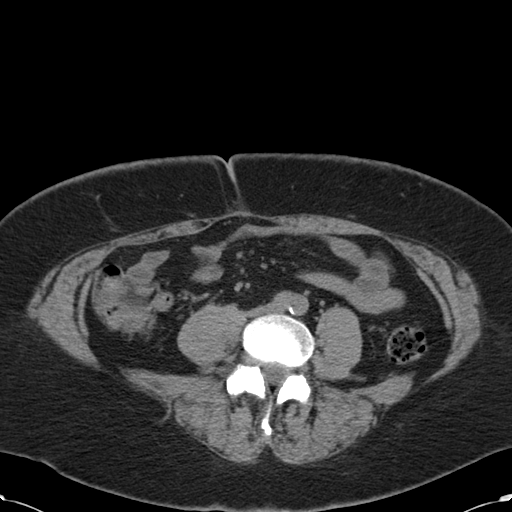
[im 49/94  soft-tissue]
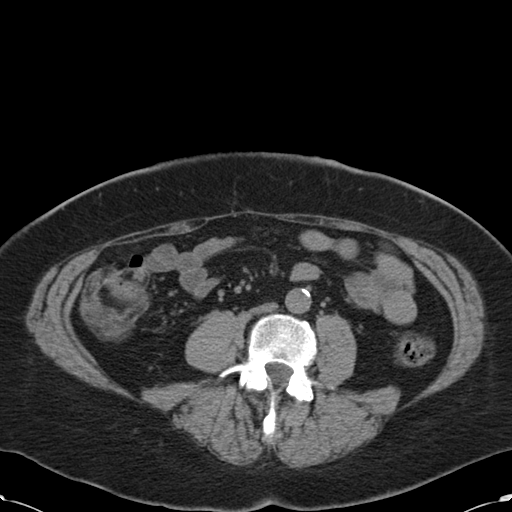
[im 56/94  soft-tissue]
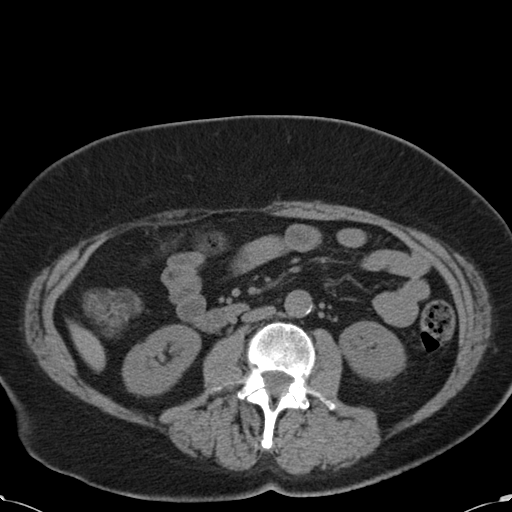
[im 56/94  bone]
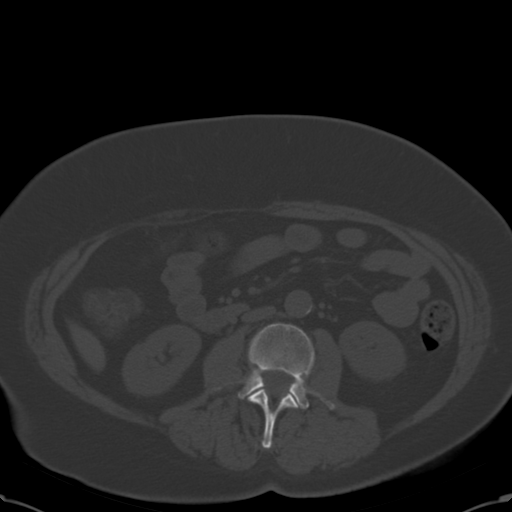
[im 64/94  soft-tissue]
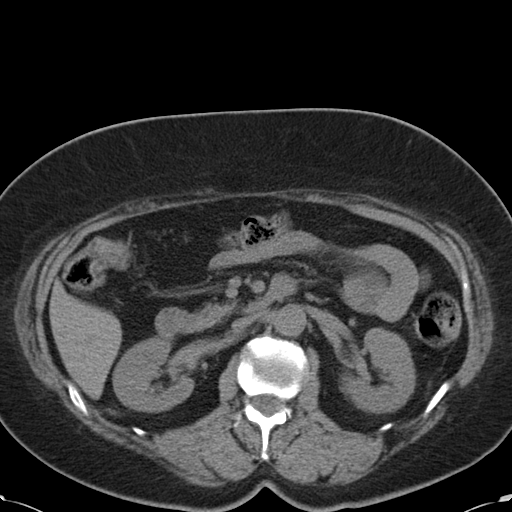
[im 71/94  soft-tissue]
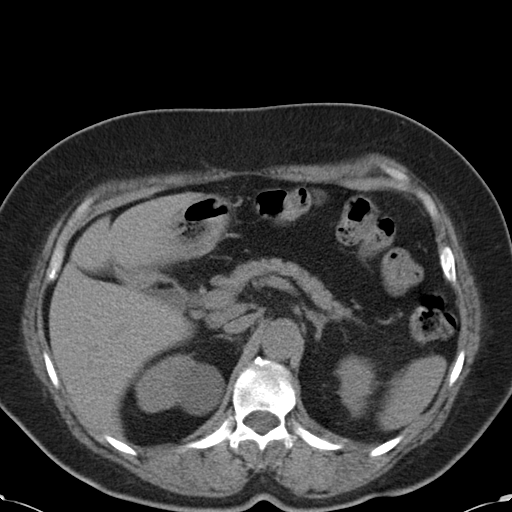
[im 75/94  soft-tissue]
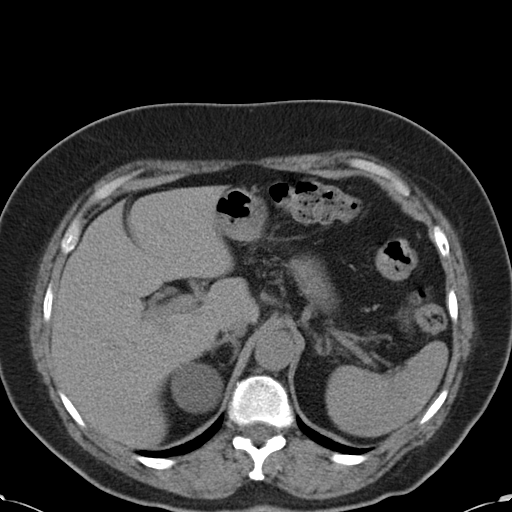
[im 82/94  soft-tissue]
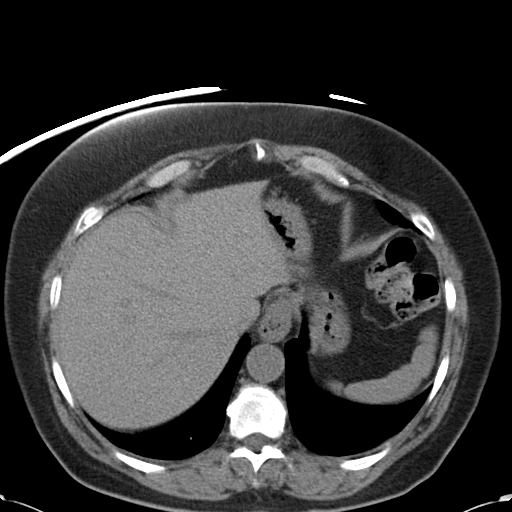
[im 90/94  soft-tissue]
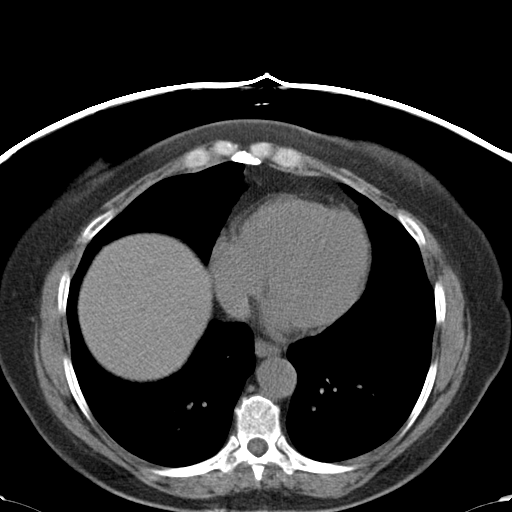

[Series 602: <mpr range> · coronal · 0.95mm/px · 3 of 115 slices shown]
[im 39/115  soft-tissue]
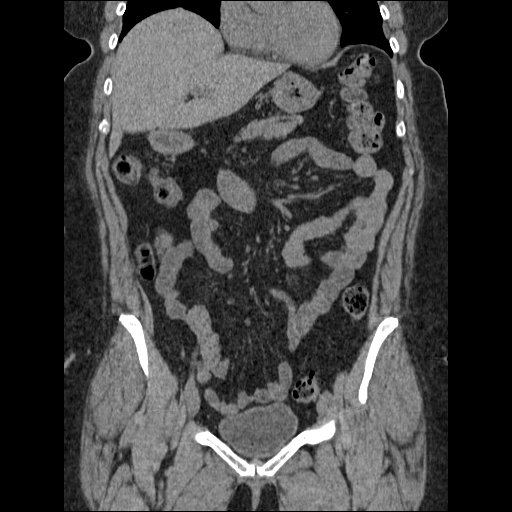
[im 51/115  soft-tissue]
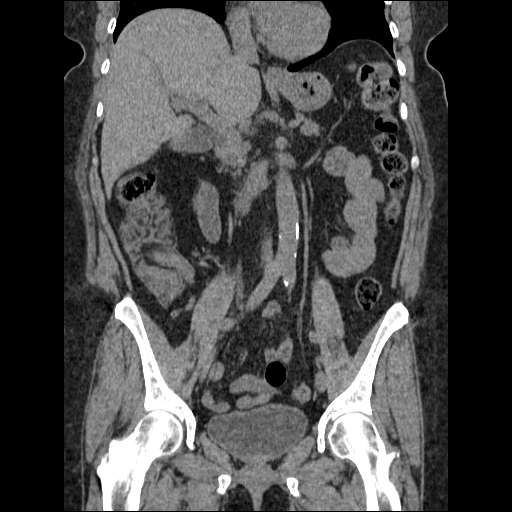
[im 64/115  soft-tissue]
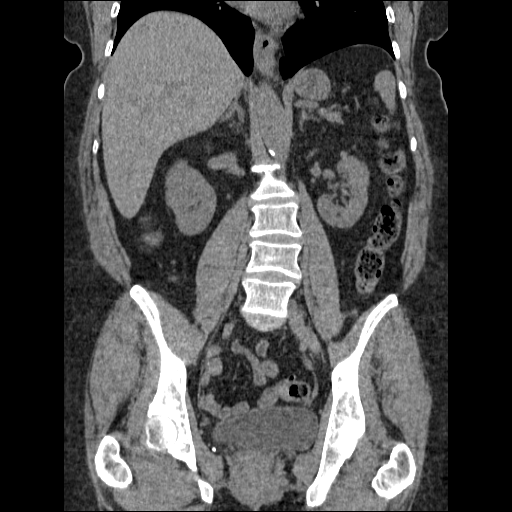

[17 of 46 positions shown; findings below may reference images not displayed]

FINDINGS: No focal abnormalities seen in the liver or spleen on
this study performed without intravenous contrast material.  There
is a tiny hiatal hernia.  Stomach is otherwise unremarkable.  The
duodenum, pancreas, and adrenal glands are normal.  Gallbladder is
surgically absent.  4.3 cm cyst in the upper pole of the right
kidney has increased minimally from 3.7 cm on the previous study.
Otherwise, both kidneys have normal imaging features without
hydronephrosis or evidence for renal stones.

No abdominal aortic aneurysm.  There is no free fluid in the
abdomen.  The abdominal bowel loops are normal in appearance.

There is no free fluid in the pelvis.  No pelvic lymphadenopathy.
The appendix is normal.  The terminal ileum is normal.
Diverticulosis of the sigmoid colon is evident without
diverticulitis.

There are no stones and either ureter or within the urinary
bladder. Uterus is surgically absent.  There is no adnexal mass.
IMPRESSION: No CT findings to explain the patient's history of pain.

Minimal increase in size of the right upper pole renal cyst.  This
was evaluated by ultrasound on 03/07/2009 and had simple imaging
features at that time.

## 2012-05-25 ENCOUNTER — Encounter: Payer: Self-pay | Admitting: Gastroenterology

## 2013-06-20 ENCOUNTER — Telehealth: Payer: Self-pay | Admitting: *Deleted

## 2013-07-03 ENCOUNTER — Encounter: Payer: Self-pay | Admitting: Gastroenterology

## 2014-07-13 ENCOUNTER — Encounter: Payer: Self-pay | Admitting: Gastroenterology

## 2017-06-18 ENCOUNTER — Telehealth (HOSPITAL_BASED_OUTPATIENT_CLINIC_OR_DEPARTMENT_OTHER): Payer: Self-pay

## 2017-06-18 NOTE — Telephone Encounter (Signed)
Referral received from [facility] in [place], [place] for diabetes.  Faxed their office a note that patient must complete diabetes education before we can schedule appointment. Fax confirmed. Tattiana Fakhouri, MA  06/18/2017, 13:05

## 2017-07-06 NOTE — Telephone Encounter (Signed)
A second referral was received with an urgent request to be seen from [facility].  Referral was reviewed by Dr. Jean RosenthalJackson and he instructed that patient must have Diabetes Education before we can schedule in endo.  Faxed [facility] this information. Fax confirmed. Fritzie Prioleau, MA  07/06/2017, 08:52

## 2021-09-03 DEATH — deceased

## 2023-04-17 ENCOUNTER — Ambulatory Visit (INDEPENDENT_AMBULATORY_CARE_PROVIDER_SITE_OTHER): Payer: Self-pay

## 2023-04-17 ENCOUNTER — Other Ambulatory Visit: Payer: Self-pay
